# Patient Record
Sex: Male | Born: 1985 | Race: Black or African American | Hispanic: No | Marital: Married | Smoking: Never smoker
Health system: Southern US, Community
[De-identification: ages and names within clinical notes are randomized; demographics above are authoritative.]

---
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
## 2007-12-18 ENCOUNTER — Emergency Department (HOSPITAL_COMMUNITY): Admission: EM | Admit: 2007-12-18 | Discharge: 2007-12-18 | Payer: Self-pay | Admitting: Emergency Medicine

## 2007-12-20 ENCOUNTER — Emergency Department (HOSPITAL_COMMUNITY): Admission: EM | Admit: 2007-12-20 | Discharge: 2007-12-20 | Payer: Self-pay | Admitting: Emergency Medicine

## 2008-01-16 ENCOUNTER — Emergency Department (HOSPITAL_COMMUNITY): Admission: EM | Admit: 2008-01-16 | Discharge: 2008-01-16 | Payer: Self-pay | Admitting: Emergency Medicine

## 2008-01-19 ENCOUNTER — Emergency Department (HOSPITAL_COMMUNITY): Admission: EM | Admit: 2008-01-19 | Discharge: 2008-01-19 | Payer: Self-pay | Admitting: Emergency Medicine

## 2008-09-22 ENCOUNTER — Emergency Department (HOSPITAL_COMMUNITY): Admission: EM | Admit: 2008-09-22 | Discharge: 2008-09-22 | Payer: Self-pay | Admitting: Emergency Medicine

## 2008-09-26 ENCOUNTER — Emergency Department (HOSPITAL_COMMUNITY): Admission: EM | Admit: 2008-09-26 | Discharge: 2008-09-26 | Payer: Self-pay | Admitting: Emergency Medicine

## 2008-10-09 ENCOUNTER — Emergency Department (HOSPITAL_COMMUNITY): Admission: EM | Admit: 2008-10-09 | Discharge: 2008-10-09 | Payer: Self-pay | Admitting: Emergency Medicine

## 2009-04-25 ENCOUNTER — Emergency Department (HOSPITAL_COMMUNITY)
Admission: EM | Admit: 2009-04-25 | Discharge: 2009-04-25 | Payer: Self-pay | Source: Home / Self Care | Admitting: Emergency Medicine

## 2011-02-02 ENCOUNTER — Ambulatory Visit (INDEPENDENT_AMBULATORY_CARE_PROVIDER_SITE_OTHER): Payer: BC Managed Care – PPO

## 2011-02-02 DIAGNOSIS — B356 Tinea cruris: Secondary | ICD-10-CM

## 2011-07-20 ENCOUNTER — Ambulatory Visit (INDEPENDENT_AMBULATORY_CARE_PROVIDER_SITE_OTHER): Payer: BC Managed Care – PPO | Admitting: Emergency Medicine

## 2011-07-20 VITALS — BP 135/84 | HR 64 | Temp 98.3°F | Resp 16 | Ht 68.5 in | Wt 159.8 lb

## 2011-07-20 DIAGNOSIS — Z111 Encounter for screening for respiratory tuberculosis: Secondary | ICD-10-CM

## 2011-07-20 NOTE — Progress Notes (Signed)
@  UMFCLOGO@  Patient ID: John Curtis MRN: [id number], DOB: 1985/05/31, 26 y.o. Date of Encounter: 07/20/2011, 2:10 PM  Primary Physician: [name].  Chief Complaint: PPD placement  HPI: 26 y.o. year old male here for PPD. No prior positive PPD. Asymptomatic. Needs for school.    No past medical history on file.   Home Meds: Prior to Admission medications   Not on File    Allergies: No Known Allergies  History   Social History  . Marital Status: Single    Spouse Name: N/A    Number of Children: N/A  . Years of Education: N/A   Occupational History  . Not on file.   Social History Main Topics  . Smoking status: Never Smoker   . Smokeless tobacco: Not on file  . Alcohol Use: Not on file  . Drug Use: Not on file  . Sexually Active: Not on file   Other Topics Concern  . Not on file   Social History Narrative  . No narrative on file     Review of Systems: Noncontributory    ASSESSMENT AND PLAN:  26 y.o. year old male here for PPD. -PPD placed -RTC 48-72 hours for reading  Signed, Earl Lites, MD 07/20/2011 2:10 PM

## 2011-07-22 ENCOUNTER — Encounter (INDEPENDENT_AMBULATORY_CARE_PROVIDER_SITE_OTHER): Payer: BC Managed Care – PPO

## 2011-07-22 DIAGNOSIS — Z111 Encounter for screening for respiratory tuberculosis: Secondary | ICD-10-CM

## 2012-01-24 ENCOUNTER — Ambulatory Visit (INDEPENDENT_AMBULATORY_CARE_PROVIDER_SITE_OTHER): Payer: BC Managed Care – PPO | Admitting: Family Medicine

## 2012-01-24 VITALS — BP 114/73 | HR 65 | Temp 98.1°F | Resp 16 | Ht 69.0 in | Wt 156.4 lb

## 2012-01-24 DIAGNOSIS — J40 Bronchitis, not specified as acute or chronic: Secondary | ICD-10-CM

## 2012-01-24 MED ORDER — AZITHROMYCIN 250 MG PO TABS: ORAL_TABLET | ORAL | Status: DC

## 2012-01-24 MED ORDER — HYDROCODONE-HOMATROPINE 5-1.5 MG/5ML PO SYRP: 5.0000 mL | ORAL_SOLUTION | Freq: Three times a day (TID) | ORAL | Status: DC | PRN

## 2012-01-24 NOTE — Progress Notes (Signed)
@UMFCLOGO @   Patient ID: John Curtis MRN: [id number], DOB: May 21, 1985, 26 y.o. Date of Encounter: 01/24/2012, 2:49 PM  Primary Physician: [name].  Chief Complaint:  Chief Complaint  Patient presents with  . Cough    yellow mucus x 2 weeks  . Shortness of Breath    HPI: 26 y.o. year old male presents with a 14  day history of nasal congestion, post nasal drip, sore throat, and cough. Mild sinus pressure. Afebrile. No chills. Nasal congestion thick and green/yellow. Cough is productive of green/yellow sputum and not associated with time of day. Ears feel full, leading to sensation of muffled hearing. Has tried OTC cold preps without success. No GI complaints.   No sick contacts, recent antibiotics, or recent travels.   No leg trauma, sedentary periods, h/o cancer, or tobacco use.  History reviewed. No pertinent past medical history.   Home Meds: Prior to Admission medications   Medication Sig Start Date End Date Taking? Authorizing Provider  azithromycin (ZITHROMAX Z-PAK) 250 MG tablet Take as directed on pack 01/24/12   Elvina Sidle, MD  HYDROcodone-homatropine [facility]) 5-1.5 MG/5ML syrup Take 5 mLs by mouth every 8 (eight) hours as needed for cough. 01/24/12   Elvina Sidle, MD    Allergies: No Known Allergies  History   Social History  . Marital Status: Single    Spouse Name: N/A    Number of Children: N/A  . Years of Education: N/A   Occupational History  . Not on file.   Social History Main Topics  . Smoking status: Never Smoker   . Smokeless tobacco: Not on file  . Alcohol Use: Not on file  . Drug Use: Not on file  . Sexually Active: Not on file   Other Topics Concern  . Not on file   Social History Narrative  . No narrative on file     Review of Systems: Constitutional: negative for chills, fever, night sweats or weight changes Cardiovascular: negative for chest pain or palpitations Respiratory: negative for hemoptysis,  wheezing, or shortness of breath Abdominal: negative for abdominal pain, nausea, vomiting or diarrhea Dermatological: negative for rash Neurologic: negative for headache   Physical Exam Blood pressure 114/73, pulse 65, temperature 98.1 F (36.7 C), temperature source Oral, resp. rate 16, height 5\' 9"  (1.753 m), weight 156 lb 6.4 oz (70.943 kg), SpO2 99.00%., Body mass index is 23.10 kg/(m^2). General: Well developed, well nourished, in no acute distress. Head: Normocephalic, atraumatic, eyes without discharge, sclera non-icteric, nares are congested. Bilateral auditory canals clear, TM's are without perforation, pearly grey with reflective cone of light bilaterally. No sinus TTP. Oral cavity moist, dentition normal. Posterior pharynx with post nasal drip and mild erythema. No peritonsillar abscess or tonsillar exudate. Neck: Supple. No thyromegaly. Full ROM. No lymphadenopathy. Lungs: Coarse breath sounds bilaterally without wheezes, rales, or rhonchi. Breathing is unlabored.  Heart: RRR with S1 S2. No murmurs, rubs, or gallops appreciated. Msk:  Strength and tone normal for age. Extremities: No clubbing or cyanosis. No edema. Neuro: Alert and oriented X 3. Moves all extremities spontaneously. CNII-XII grossly in tact. Psych:  Responds to questions appropriately with a normal affect.      ASSESSMENT AND PLAN:  26 y.o. year old male with bronchitis. 1. Bronchitis  HYDROcodone-homatropine (HYCODAN) 5-1.5 MG/5ML syrup, azithromycin (ZITHROMAX Z-PAK) 250 MG tablet    - -Mucinex -Tylenol/Motrin prn -Rest/fluids -RTC precautions -RTC 3-5 days if no improvement  Signed, Elvina Sidle, MD 01/24/2012 2:49 PM

## 2013-06-24 ENCOUNTER — Ambulatory Visit (INDEPENDENT_AMBULATORY_CARE_PROVIDER_SITE_OTHER): Payer: BC Managed Care – PPO | Admitting: Internal Medicine

## 2013-06-24 VITALS — BP 120/72 | HR 71 | Temp 98.0°F | Resp 18 | Ht 68.5 in | Wt 165.0 lb

## 2013-06-24 DIAGNOSIS — L299 Pruritus, unspecified: Secondary | ICD-10-CM

## 2013-06-24 DIAGNOSIS — L259 Unspecified contact dermatitis, unspecified cause: Secondary | ICD-10-CM

## 2013-06-24 DIAGNOSIS — L309 Dermatitis, unspecified: Secondary | ICD-10-CM

## 2013-06-24 MED ORDER — CLOBETASOL PROPIONATE 0.05 % EX CREA: 1.0000 "application " | TOPICAL_CREAM | Freq: Two times a day (BID) | CUTANEOUS | Status: DC

## 2013-06-24 NOTE — Patient Instructions (Signed)
Eczema Eczema, also called atopic dermatitis, is a skin disorder that causes inflammation of the skin. It causes a red rash and dry, scaly skin. The skin becomes very itchy. Eczema is generally worse during the cooler winter months and often improves with the warmth of summer. Eczema usually starts showing signs in infancy. Some children outgrow eczema, but it may last through adulthood.  CAUSES  The exact cause of eczema is not known, but it appears to run in families. People with eczema often have a family history of eczema, allergies, asthma, or hay fever. Eczema is not contagious. Flare-ups of the condition may be caused by:   Contact with something you are sensitive or allergic to.   Stress. SIGNS AND SYMPTOMS  Dry, scaly skin.   Red, itchy rash.   Itchiness. This may occur before the skin rash and may be very intense.  DIAGNOSIS  The diagnosis of eczema is usually made based on symptoms and medical history. TREATMENT  Eczema cannot be cured, but symptoms usually can be controlled with treatment and other strategies. A treatment plan might include:  Controlling the itching and scratching.   Use over-the-counter antihistamines as directed for itching. This is especially useful at night when the itching tends to be worse.   Use over-the-counter steroid creams as directed for itching.   Avoid scratching. Scratching makes the rash and itching worse. It may also result in a skin infection (impetigo) due to a break in the skin caused by scratching.   Keeping the skin well moisturized with creams every day. This will seal in moisture and help prevent dryness. Lotions that contain alcohol and water should be avoided because they can dry the skin.   Limiting exposure to things that you are sensitive or allergic to (allergens).   Recognizing situations that cause stress.   Developing a plan to manage stress.  HOME CARE INSTRUCTIONS   Only take over-the-counter or  prescription medicines as directed by your health care provider.   Do not use anything on the skin without checking with your health care provider.   Keep baths or showers short (5 minutes) in warm (not hot) water. Use mild cleansers for bathing. These should be unscented. You may add nonperfumed bath oil to the bath water. It is best to avoid soap and bubble bath.   Immediately after a bath or shower, when the skin is still damp, apply a moisturizing ointment to the entire body. This ointment should be a petroleum ointment. This will seal in moisture and help prevent dryness. The thicker the ointment, the better. These should be unscented.   Keep fingernails cut short. Children with eczema may need to wear soft gloves or mittens at night after applying an ointment.   Dress in clothes made of cotton or cotton blends. Dress lightly, because heat increases itching.   A child with eczema should stay away from anyone with fever blisters or cold sores. The virus that causes fever blisters (herpes simplex) can cause a serious skin infection in children with eczema. SEEK MEDICAL CARE IF:   Your itching interferes with sleep.   Your rash gets worse or is not better within 1 week after starting treatment.   You see pus or soft yellow scabs in the rash area.   You have a fever.   You have a rash flare-up after contact with someone who has fever blisters.  Document Released: 02/01/2000 Document Revised: 11/24/2012 Document Reviewed: 09/06/2012 [employer] Patient Information 2014 [employer].  

## 2013-06-24 NOTE — Progress Notes (Signed)
   Subjective:    Patient ID: John Curtis, male    DOB: 1985-08-11, 28 y.o.   MRN: [id number]  HPI 28 year old pt here for a rash. He gets the rash when it is hot out. He has been having the rash for about three years. He mainly gets it in the crease of his elbow, neck, and on his ankles. No OTC medications used. Has not used any cortisone cream. Has had a previous rx of clobetasol and he says it worked good for him. He does not have anymore clobetasol. Pt works at [employer] [place] [place].    Review of Systems     Objective:   Physical Exam  Constitutional: He is oriented to person, place, and time. He appears well-developed and well-nourished. No distress.  HENT:  Head: Normocephalic.  Eyes: EOM are normal.  Cardiovascular: Normal rate.   Pulmonary/Chest: Effort normal.  Neurological: He is alert and oriented to person, place, and time. He exhibits normal muscle tone. Coordination normal.  Skin: Skin is intact. Rash noted. Rash is maculopapular.         Eczema    Assessment & Plan:  Clobetasol cream bid prn/cetirizine 10mg  qd

## 2013-11-18 ENCOUNTER — Ambulatory Visit (INDEPENDENT_AMBULATORY_CARE_PROVIDER_SITE_OTHER): Payer: BC Managed Care – PPO | Admitting: Family Medicine

## 2013-11-18 VITALS — BP 122/80 | HR 60 | Temp 97.8°F | Resp 16 | Ht 69.0 in | Wt 163.2 lb

## 2013-11-18 DIAGNOSIS — R1084 Generalized abdominal pain: Secondary | ICD-10-CM

## 2013-11-18 DIAGNOSIS — R195 Other fecal abnormalities: Secondary | ICD-10-CM

## 2013-11-18 DIAGNOSIS — R110 Nausea: Secondary | ICD-10-CM

## 2013-11-18 LAB — POCT CBC
GRANULOCYTE PERCENT: 76.5 % (ref 37–80)
HEMATOCRIT: 47.2 % (ref 43.5–53.7)
HEMOGLOBIN: 15.6 g/dL (ref 14.1–18.1)
Lymph, poc: 1.4 (ref 0.6–3.4)
MCH, POC: 29.4 pg (ref 27–31.2)
MCHC: 33.2 g/dL (ref 31.8–35.4)
MCV: 88.5 fL (ref 80–97)
MID (cbc): 0.2 (ref 0–0.9)
MPV: 6.5 fL (ref 0–99.8)
POC Granulocyte: 5.3 (ref 2–6.9)
POC LYMPH PERCENT: 19.9 %L (ref 10–50)
POC MID %: 3.6 %M (ref 0–12)
Platelet Count, POC: 252 10*3/uL (ref 142–424)
RBC: 5.33 M/uL (ref 4.69–6.13)
RDW, POC: 11.8 %
WBC: 6.9 10*3/uL (ref 4.6–10.2)

## 2013-11-18 NOTE — Progress Notes (Signed)
Subjective:    Patient ID: John Curtis, male    DOB: [birth year], 28 y.o.   MRN: [id number]  HPI John Curtis is a 28 y.o. male  Complains of abdominal pain - started at 11pm last night at work.  Works at [employer], loading. Mid stomach. Nausea, no vomiting. No fever. Noticed after eating 3 tangerines, but had eaten [place] food - Salsaritas and Dione Plover about 6 hours prior.   Pain feels to be tapering off since worst pain at 3am. Slight loose stool overnight, but no diarrhea. Small stool - loose this am.   Son sick recently with cough and stomach ache. No recent foreign travel.  Did eat somewhat undercooked hamburger 3 days ago.   Tx: none.   There are no active problems to display for this patient.  History reviewed. No pertinent past medical history. Past Surgical History  Procedure Laterality Date  . Hip surgery  1994   No Known Allergies Prior to Admission medications   Medication Sig Start Date End Date Taking? Authorizing Provider  clobetasol cream (TEMOVATE) 0.05 % Apply 1 application topically 2 (two) times daily. 06/24/13  Yes Jonita Albee, MD  azithromycin (ZITHROMAX Z-PAK) 250 MG tablet Take as directed on pack 01/24/12   Elvina Sidle, MD  HYDROcodone-homatropine [facility]) 5-1.5 MG/5ML syrup Take 5 mLs by mouth every 8 (eight) hours as needed for cough. 01/24/12   Elvina Sidle, MD   History   Social History  . Marital Status: Married    Spouse Name: N/A    Number of Children: N/A  . Years of Education: N/A   Occupational History  . Not on file.   Social History Main Topics  . Smoking status: Never Smoker   . Smokeless tobacco: Not on file  . Alcohol Use: Not on file  . Drug Use: Not on file  . Sexual Activity: Not on file   Other Topics Concern  . Not on file   Social History Narrative  . No narrative on file       Review of Systems     Objective:   Physical Exam  Vitals reviewed. Constitutional: He is oriented to person, place, and time.  He appears well-developed and well-nourished. No distress.  HENT:  Head: Normocephalic and atraumatic.  Mouth/Throat: Oropharynx is clear and moist.  Eyes: EOM are normal. Pupils are equal, round, and reactive to light.  Neck: No JVD present. Carotid bruit is not present.  Cardiovascular: Normal rate, regular rhythm and normal heart sounds.   No murmur heard. Pulmonary/Chest: Effort normal and breath sounds normal. He has no rales.  Abdominal: Normal appearance. Bowel sounds are increased. There is tenderness in the right upper quadrant, epigastric area and left upper quadrant. There is no rebound, no CVA tenderness, no tenderness at McBurney's point and negative Murphy's sign.  Musculoskeletal: He exhibits no edema.  Neurological: He is alert and oriented to person, place, and time.  Skin: Skin is warm and dry. He is not diaphoretic.  Psychiatric: He has a normal mood and affect.   Filed Vitals:   11/18/13 0858  BP: 122/80  Pulse: 60  Temp: 97.8 F (36.6 C)  TempSrc: Oral  Resp: 16  Height: 5\' 9"  (1.753 m)  Weight: 163 lb 3.2 oz (74.027 kg)  SpO2: 100%   . Results for orders placed in visit on 11/18/13  POCT CBC      Result Value Ref Range   WBC 6.9  4.6 - 10.2 K/uL  Lymph, poc 1.4  0.6 - 3.4   POC LYMPH PERCENT 19.9  10 - 50 %L   MID (cbc) 0.2  0 - 0.9   POC MID % 3.6  0 - 12 %M   POC Granulocyte 5.3  2 - 6.9   Granulocyte percent 76.5  37 - 80 %G   RBC 5.33  4.69 - 6.13 M/uL   Hemoglobin 15.6  14.1 - 18.1 g/dL   HCT, POC 40.947.2  81.143.5 - 53.7 %   MCV 88.5  80 - 97 fL   MCH, POC 29.4  27 - 31.2 pg   MCHC 33.2  31.8 - 35.4 g/dL   RDW, POC 91.411.8     Platelet Count, POC 252  142 - 424 K/uL   MPV 6.5  0 - 99.8 fL      Assessment & Plan:  John RoyalDarren Curtis is a 28 y.o. male Loose stools - Plan: POCT CBC  Nausea - Plan: POCT CBC  Generalized abdominal pain - Plan: POCT CBC  Viral GE vs. mild food poisoning. Symptoms slightly improved this am. Reassuring CBC.    -sx care,  bland foods, RTC precautions discussed.   No orders of the defined types were placed in this encounter.   Patient Instructions  Your blood count appears ok today. You may have "food poisoning" or a virus that appears to be improving. If any blood in stool, or any worsening over next few days - return for recheck. Ok to take Pepto and Immodium if needed. Return to the clinic or go to the nearest emergency room if any of your symptoms worsen or new symptoms occur.  Abdominal Pain Many things can cause abdominal pain. Usually, abdominal pain is not caused by a disease and will improve without treatment. It can often be observed and treated at home. Your health care provider will do a physical exam and possibly order blood tests and X-rays to help determine the seriousness of your pain. However, in many cases, more time must pass before a clear cause of the pain can be found. Before that point, your health care provider may not know if you need more testing or further treatment. HOME CARE INSTRUCTIONS  Monitor your abdominal pain for any changes. The following actions may help to alleviate any discomfort you are experiencing:  Only take over-the-counter or prescription medicines as directed by your health care provider.  Do not take laxatives unless directed to do so by your health care provider.  Try a clear liquid diet (broth, tea, or water) as directed by your health care provider. Slowly move to a bland diet as tolerated. SEEK MEDICAL CARE IF:  You have unexplained abdominal pain.  You have abdominal pain associated with nausea or diarrhea.  You have pain when you urinate or have a bowel movement.  You experience abdominal pain that wakes you in the night.  You have abdominal pain that is worsened or improved by eating food.  You have abdominal pain that is worsened with eating fatty foods.  You have a fever. SEEK IMMEDIATE MEDICAL CARE IF:   Your pain does not go away within 2  hours.  You keep throwing up (vomiting).  Your pain is felt only in portions of the abdomen, such as the right side or the left lower portion of the abdomen.  You pass bloody or black tarry stools. MAKE SURE YOU:  Understand these instructions.   Will watch your condition.   Will get help right away  if you are not doing well or get worse.  Document Released: 11/13/2004 Document Revised: 02/08/2013 Document Reviewed: 10/13/2012 [facility] Patient Information 2015 [place], [place]. This information is not intended to replace advice given to you by your health care provider. Make sure you discuss any questions you have with your health care provider.

## 2013-11-18 NOTE — Patient Instructions (Signed)
Your blood count appears ok today. You may have "food poisoning" or a virus that appears to be improving. If any blood in stool, or any worsening over next few days - return for recheck. Ok to take Pepto and Immodium if needed. Return to the clinic or go to the nearest emergency room if any of your symptoms worsen or new symptoms occur.  Abdominal Pain Many things can cause abdominal pain. Usually, abdominal pain is not caused by a disease and will improve without treatment. It can often be observed and treated at home. Your health care provider will do a physical exam and possibly order blood tests and X-rays to help determine the seriousness of your pain. However, in many cases, more time must pass before a clear cause of the pain can be found. Before that point, your health care provider may not know if you need more testing or further treatment. HOME CARE INSTRUCTIONS  Monitor your abdominal pain for any changes. The following actions may help to alleviate any discomfort you are experiencing:  Only take over-the-counter or prescription medicines as directed by your health care provider.  Do not take laxatives unless directed to do so by your health care provider.  Try a clear liquid diet (broth, tea, or water) as directed by your health care provider. Slowly move to a bland diet as tolerated. SEEK MEDICAL CARE IF:  You have unexplained abdominal pain.  You have abdominal pain associated with nausea or diarrhea.  You have pain when you urinate or have a bowel movement.  You experience abdominal pain that wakes you in the night.  You have abdominal pain that is worsened or improved by eating food.  You have abdominal pain that is worsened with eating fatty foods.  You have a fever. SEEK IMMEDIATE MEDICAL CARE IF:   Your pain does not go away within 2 hours.  You keep throwing up (vomiting).  Your pain is felt only in portions of the abdomen, such as the right side or the left lower  portion of the abdomen.  You pass bloody or black tarry stools. MAKE SURE YOU:  Understand these instructions.   Will watch your condition.   Will get help right away if you are not doing well or get worse.  Document Released: 11/13/2004 Document Revised: 02/08/2013 Document Reviewed: 10/13/2012 [facility] Patient Information 2015 [place]. This information is not intended to replace advice given to you by your health care provider. Make sure you discuss any questions you have with your health care provider.

## 2014-01-19 ENCOUNTER — Ambulatory Visit (INDEPENDENT_AMBULATORY_CARE_PROVIDER_SITE_OTHER): Payer: BC Managed Care – PPO | Admitting: Sports Medicine

## 2014-01-19 VITALS — BP 120/70 | HR 69 | Temp 98.4°F | Resp 12 | Ht 68.75 in | Wt 165.2 lb

## 2014-01-19 DIAGNOSIS — J069 Acute upper respiratory infection, unspecified: Secondary | ICD-10-CM

## 2014-01-19 MED ORDER — PSEUDOEPHEDRINE HCL 60 MG PO TABS: 60.0000 mg | ORAL_TABLET | Freq: Three times a day (TID) | ORAL | Status: DC | PRN

## 2014-01-19 MED ORDER — OXYMETAZOLINE HCL 0.05 % NA SOLN: 1.0000 | Freq: Two times a day (BID) | NASAL | Status: DC

## 2014-01-19 NOTE — Progress Notes (Signed)
   Subjective:    Patient ID: John Curtis, male    DOB: 03/18/1985, 28 y.o.   MRN: [id number]  HPI John Curtis is a 28yo male who presents with cough and nasal congestion for the past week. He noted body aches initially. He noted a mild generalized headache. He then developed congestion and sore throat with a non-productive cough. He thinks he noticed a subjective fever and chill initially, but this since resolved. He is not currently taking any medications. He has tried resting and drinking fluids.  Sick contacts: His parents, who he lives with similar symptoms. No recent travel.  Wife is pregnant at home. He did not receive a flu vaccine.  Past medical history, social history, medications, and allergies were reviewed and are up to date in the chart.  Review of Systems 7 point review of systems was performed and was otherwise negative unless noted in the history of present illness.     Objective:   Physical Exam BP 120/70 mmHg  Pulse 69  Temp(Src) 98.4 F (36.9 C) (Oral)  Resp 12  Ht 5' 8.75" (1.746 m)  Wt 165 lb 4 oz (74.957 kg)  BMI 24.59 kg/m2  SpO2 98% General appearance: alert, cooperative and appears stated age Head: Normocephalic, without obvious abnormality, atraumatic Eyes: conjunctivae/corneas clear. PERRL, EOM's intact. Fundi benign. Ears: normal TM's and external ear canals both ears Nose: erythema, clear nasal discharge. Throat: post nasal drip. uvula midline. no peritonsillar abscess Neck: no adenopathy, no carotid bruit, no JVD, supple, symmetrical, trachea midline and thyroid not enlarged, symmetric, no tenderness/mass/nodules Lungs: clear to auscultation bilaterally Heart: regular rate and rhythm, S1, S2 normal, no murmur, click, rub or gallop Skin: Skin color, texture, turgor normal. No rashes or lesions Lymph nodes: Cervical, supraclavicular, and axillary nodes normal.    Assessment & Plan:  1. Upper respiratory infection -Rx Afrin nasal spray and Sudafed  with precautions -Discussed supportive cares -If noticing increasing cough, fever >102, shortness of breath, wheezing, lethargy, or for any other concerns, then return to the clinic or go to the emergency department. Patient verbalized understanding and agreement. -Recommended flu vaccine when he is feeling better, as well as make sure his pregnant wife is vaccinated.  Dr. Joellyn HaffPick-Jacobs, DO Sports Medicine Fellow

## 2014-01-19 NOTE — Patient Instructions (Signed)
Upper Respiratory Infection, Adult An upper respiratory infection (URI) is also sometimes known as the common cold. The upper respiratory tract includes the nose, sinuses, throat, trachea, and bronchi. Bronchi are the airways leading to the lungs. Most people improve within 1 week, but symptoms can last up to 2 weeks. A residual cough may last even longer.  CAUSES Many different viruses can infect the tissues lining the upper respiratory tract. The tissues become irritated and inflamed and often become very moist. Mucus production is also common. A cold is contagious. You can easily spread the virus to others by oral contact. This includes kissing, sharing a glass, coughing, or sneezing. Touching your mouth or nose and then touching a surface, which is then touched by another person, can also spread the virus. SYMPTOMS  Symptoms typically develop 1 to 3 days after you come in contact with a cold virus. Symptoms vary from person to person. They may include:  Runny nose.  Sneezing.  Nasal congestion.  Sinus irritation.  Sore throat.  Loss of voice (laryngitis).  Cough.  Fatigue.  Muscle aches.  Loss of appetite.  Headache.  Low-grade fever. DIAGNOSIS  You might diagnose your own cold based on familiar symptoms, since most people get a cold 2 to 3 times a year. Your caregiver can confirm this based on your exam. Most importantly, your caregiver can check that your symptoms are not due to another disease such as strep throat, sinusitis, pneumonia, asthma, or epiglottitis. Blood tests, throat tests, and X-rays are not necessary to diagnose a common cold, but they may sometimes be helpful in excluding other more serious diseases. Your caregiver will decide if any further tests are required. RISKS AND COMPLICATIONS  You may be at risk for a more severe case of the common cold if you smoke cigarettes, have chronic heart disease (such as heart failure) or lung disease (such as asthma), or if  you have a weakened immune system. The very young and very old are also at risk for more serious infections. Bacterial sinusitis, middle ear infections, and bacterial pneumonia can complicate the common cold. The common cold can worsen asthma and chronic obstructive pulmonary disease (COPD). Sometimes, these complications can require emergency medical care and may be life-threatening. PREVENTION  The best way to protect against getting a cold is to practice good hygiene. Avoid oral or hand contact with people with cold symptoms. Wash your hands often if contact occurs. There is no clear evidence that vitamin C, vitamin E, echinacea, or exercise reduces the chance of developing a cold. However, it is always recommended to get plenty of rest and practice good nutrition. TREATMENT  Treatment is directed at relieving symptoms. There is no cure. Antibiotics are not effective, because the infection is caused by a virus, not by bacteria. Treatment may include:  Increased fluid intake. Sports drinks offer valuable electrolytes, sugars, and fluids.  Breathing heated mist or steam (vaporizer or shower).  Eating chicken soup or other clear broths, and maintaining good nutrition.  Getting plenty of rest.  Using gargles or lozenges for comfort.  Controlling fevers with ibuprofen or acetaminophen as directed by your caregiver.  Increasing usage of your inhaler if you have asthma. Zinc gel and zinc lozenges, taken in the first 24 hours of the common cold, can shorten the duration and lessen the severity of symptoms. Pain medicines may help with fever, muscle aches, and throat pain. A variety of non-prescription medicines are available to treat congestion and runny nose. Your caregiver   can make recommendations and may suggest nasal or lung inhalers for other symptoms.  HOME CARE INSTRUCTIONS   Only take over-the-counter or prescription medicines for pain, discomfort, or fever as directed by your  caregiver.  Use a warm mist humidifier or inhale steam from a shower to increase air moisture. This may keep secretions moist and make it easier to breathe.  Drink enough water and fluids to keep your urine clear or pale yellow.  Rest as needed.  Return to work when your temperature has returned to normal or as your caregiver advises. You may need to stay home longer to avoid infecting others. You can also use a face mask and careful hand washing to prevent spread of the virus. SEEK MEDICAL CARE IF:   After the first few days, you feel you are getting worse rather than better.  You need your caregiver's advice about medicines to control symptoms.  You develop chills, worsening shortness of breath, or brown or red sputum. These may be signs of pneumonia.  You develop yellow or brown nasal discharge or pain in the face, especially when you bend forward. These may be signs of sinusitis.  You develop a fever, swollen neck glands, pain with swallowing, or white areas in the back of your throat. These may be signs of strep throat. SEEK IMMEDIATE MEDICAL CARE IF:   You have a fever.  You develop severe or persistent headache, ear pain, sinus pain, or chest pain.  You develop wheezing, a prolonged cough, cough up blood, or have a change in your usual mucus (if you have chronic lung disease).  You develop sore muscles or a stiff neck. Document Released: 07/30/2000 Document Revised: 04/28/2011 Document Reviewed: 05/11/2013 Exit[employer] Patient Information 2015 [employer]. This information is not intended to replace advice given to you by your health care provider. Make sure you discuss any questions you have with your health care provider.  

## 2015-04-12 ENCOUNTER — Ambulatory Visit (INDEPENDENT_AMBULATORY_CARE_PROVIDER_SITE_OTHER): Payer: BLUE CROSS/BLUE SHIELD | Admitting: Family Medicine

## 2015-04-12 VITALS — BP 124/82 | HR 87 | Temp 98.2°F | Resp 16 | Ht 70.0 in | Wt 171.0 lb

## 2015-04-12 DIAGNOSIS — Z13220 Encounter for screening for lipoid disorders: Secondary | ICD-10-CM | POA: Diagnosis not present

## 2015-04-12 DIAGNOSIS — Z130 Encounter for screening for diseases of the blood and blood-forming organs and certain disorders involving the immune mechanism: Secondary | ICD-10-CM

## 2015-04-12 DIAGNOSIS — Z1329 Encounter for screening for other suspected endocrine disorder: Secondary | ICD-10-CM | POA: Diagnosis not present

## 2015-04-12 DIAGNOSIS — Q383 Other congenital malformations of tongue: Secondary | ICD-10-CM

## 2015-04-12 DIAGNOSIS — Z0000 Encounter for general adult medical examination without abnormal findings: Secondary | ICD-10-CM | POA: Diagnosis not present

## 2015-04-12 LAB — CBC
HCT: 45.3 % (ref 39.0–52.0)
Hemoglobin: 15.1 g/dL (ref 13.0–17.0)
MCH: 28.6 pg (ref 26.0–34.0)
MCHC: 33.3 g/dL (ref 30.0–36.0)
MCV: 85.8 fL (ref 78.0–100.0)
MPV: 8.9 fL (ref 8.6–12.4)
Platelets: 271 10*3/uL (ref 150–400)
RBC: 5.28 MIL/uL (ref 4.22–5.81)
RDW: 13 % (ref 11.5–15.5)
WBC: 7.2 10*3/uL (ref 4.0–10.5)

## 2015-04-12 LAB — TSH: TSH: 0.63 mIU/L (ref 0.40–4.50)

## 2015-04-12 NOTE — Progress Notes (Signed)
Chief Complaint:  Chief Complaint  Patient presents with  . Mass    on tongue, today  . Annual Exam    HPI: John Curtis is a 30 y.o. male who reports to [facility] today complaining of  1. Blood blister on the right side of the tongue , he doe snot remember biting it. He has never had this before, he noticed it yesterday. He has had no fevers or chills. He has no weight loss.  2. He wants an annual  No hx of STD, he has 3 children, he has had a vesctomy. He has no  No prior STD, No prior hx of MI .  Last annual was no prior annual HAs not eaten anything today, No depression.  Exercising, last dental exam was  2016. He does not chew tobacco or smoke.  LAst eye exam was in Septe mebr 2016  History reviewed. No pertinent past medical history. Past Surgical History  Procedure Laterality Date  . Hip surgery  1994   Social History   Social History  . Marital Status: Married    Spouse Name: N/A  . Number of Children: N/A  . Years of Education: N/A   Social History Main Topics  . Smoking status: Never Smoker   . Smokeless tobacco: Never Used  . Alcohol Use: No  . Drug Use: No  . Sexual Activity: Not Asked   Other Topics Concern  . None   Social History Narrative   History reviewed. No pertinent family history. No Known Allergies Prior to Admission medications   Medication Sig Start Date End Date Taking? Authorizing Provider  oxymetazoline (AFRIN NASAL SPRAY) 0.05 % nasal spray Place 1 spray into both nostrils 2 (two) times daily. Patient not taking: Reported on 04/12/2015 01/19/14   John C Pick-Jacobs, DO  pseudoephedrine (SUDAFED) 60 MG tablet Take 1 tablet (60 mg total) by mouth every 8 (eight) hours as needed for congestion. Patient not taking: Reported on 04/12/2015 01/19/14   John C Pick-Jacobs, DO     ROS: The patient denies fevers, chills, night sweats, unintentional weight loss, chest pain, palpitations, wheezing, dyspnea on exertion, nausea, vomiting, abdominal  pain, dysuria, hematuria, melena, numbness, weakness, or tingling.   All other systems have been reviewed and were otherwise negative with the exception of those mentioned in the HPI and as above.    PHYSICAL EXAM: Filed Vitals:   04/12/15 1525  BP: 124/82  Pulse: 87  Temp: 98.2 F (36.8 C)  Resp: 16   Body mass index is 24.54 kg/(m^2).   General: Alert, no acute distress HEENT:  Normocephalic, atraumatic, oropharynx patent. EOMI, PERRLA + right tongue bloodblister  Cardiovascular:  Regular rate and rhythm, no rubs murmurs or gallops.  No Carotid bruits, radial pulse intact. No pedal edema.  Respiratory: Clear to auscultation bilaterally.  No wheezes, rales, or rhonchi.  No cyanosis, no use of accessory musculature Abdominal: No organomegaly, abdomen is soft and non-tender, positive bowel sounds. No masses. Skin: No rashes. Neurologic: Facial musculature symmetric. Psychiatric: Patient acts appropriately throughout our interaction. Lymphatic: No cervical or submandibular lymphadenopathy Musculoskeletal: Gait intact. No edema, tenderness Normal testicualr exam, neg inguinal hernia, circumcised male    LABS: Results for orders placed or performed in visit on 11/18/13  POCT CBC  Result Value Ref Range   WBC 6.9 4.6 - 10.2 K/uL   Lymph, poc 1.4 0.6 - 3.4   POC LYMPH PERCENT 19.9 10 - 50 %L   MID (cbc)  0.2 0 - 0.9   POC MID % 3.6 0 - 12 %M   POC Granulocyte 5.3 2 - 6.9   Granulocyte percent 76.5 37 - 80 %G   RBC 5.33 4.69 - 6.13 M/uL   Hemoglobin 15.6 14.1 - 18.1 g/dL   HCT, POC 16.1 09.6 - 53.7 %   MCV 88.5 80 - 97 fL   MCH, POC 29.4 27 - 31.2 pg   MCHC 33.2 31.8 - 35.4 g/dL   RDW, POC 04.5 %   Platelet Count, POC 252 142 - 424 K/uL   MPV 6.5 0 - 99.8 fL     EKG/XRAY:   Primary read interpreted by Dr. Conley Rolls at [facility].   ASSESSMENT/PLAN: Encounter Diagnoses  Name Primary?  . Annual physical exam Yes  . Tongue anomaly   . Screening for deficiency anemia   .  Screening for hyperlipidemia   . Screening for thyroid disorder    Annual labs He will consider TDap  Next visit He popped the blood blister on his tongue, which I think he got from biting it. It looks benign, I have advised him to monitor it.  Fu prn   Gross sideeffects, risk and benefits, and alternatives of medications d/w patient. Patient is aware that all medications have potential sideeffects and we are unable to predict every sideeffect or drug-drug interaction that may occur.  Bev Drennen DO  04/12/2015 4:51 PM

## 2015-04-13 LAB — COMPLETE METABOLIC PANEL WITHOUT GFR
AST: 24 U/L (ref 10–40)
Albumin: 4.6 g/dL (ref 3.6–5.1)
Alkaline Phosphatase: 55 U/L (ref 40–115)
Calcium: 9.7 mg/dL (ref 8.6–10.3)
Chloride: 100 mmol/L (ref 98–110)
GFR, Est Non African American: >89 mL/min (ref >=60)
Potassium: 4.6 mmol/L (ref 3.5–5.3)

## 2015-04-13 LAB — LIPID PANEL
Cholesterol: 173 mg/dL (ref 125–200)
HDL: 92 mg/dL (ref >=40)
LDL Cholesterol: 71 mg/dL (ref <130)
Total CHOL/HDL Ratio: 1.9 ratio (ref <=5.0)
Triglycerides: 48 mg/dL (ref <150)
VLDL: 10 mg/dL (ref <30)

## 2015-04-13 LAB — COMPLETE METABOLIC PANEL WITH GFR
ALT: 16 U/L (ref 9–46)
BUN: 15 mg/dL (ref 7–25)
CO2: 26 mmol/L (ref 20–31)
Creat: 1.03 mg/dL (ref 0.60–1.35)
GFR, Est African American: >89 mL/min (ref >=60)
Glucose, Bld: 81 mg/dL (ref 65–99)
Sodium: 137 mmol/L (ref 135–146)
Total Bilirubin: 1.5 mg/dL — ABNORMAL HIGH (ref 0.2–1.2)
Total Protein: 7.7 g/dL (ref 6.1–8.1)

## 2015-12-06 ENCOUNTER — Ambulatory Visit (INDEPENDENT_AMBULATORY_CARE_PROVIDER_SITE_OTHER): Payer: BLUE CROSS/BLUE SHIELD

## 2015-12-06 ENCOUNTER — Ambulatory Visit (INDEPENDENT_AMBULATORY_CARE_PROVIDER_SITE_OTHER): Payer: BLUE CROSS/BLUE SHIELD | Admitting: Physician Assistant

## 2015-12-06 VITALS — BP 124/80 | HR 74 | Temp 98.3°F | Resp 18 | Ht 70.0 in | Wt 173.0 lb

## 2015-12-06 DIAGNOSIS — R071 Chest pain on breathing: Secondary | ICD-10-CM | POA: Diagnosis not present

## 2015-12-06 DIAGNOSIS — R0789 Other chest pain: Secondary | ICD-10-CM

## 2015-12-06 MED ORDER — NAPROXEN 500 MG PO TABS: 500.0000 mg | ORAL_TABLET | Freq: Two times a day (BID) | ORAL | 0 refills | Status: AC

## 2015-12-06 NOTE — Patient Instructions (Addendum)
Take naproxen twice a day for the next week.  If no improvement in one week, follow up.   Costochondritis Costochondritis is a condition in which the tissue (cartilage) that connects your ribs with your breastbone (sternum) becomes irritated. It causes pain in the chest and rib area. It usually goes away on its own over time. HOME CARE  Avoid activities that wear you out.  Do not strain your ribs. Avoid activities that use your:  Chest.  Belly.  Side muscles.  Put ice on the area for the first 2 days after the pain starts.  Put ice in a plastic bag.  Place a towel between your skin and the bag.  Leave the ice on for 20 minutes, 2-3 times a day.  Only take medicine as told by your doctor. GET HELP IF:  You have redness or puffiness (swelling) in the rib area.  Your pain does not go away with rest or medicine. GET HELP RIGHT AWAY IF:   Your pain gets worse.  You are very uncomfortable.  You have trouble breathing.  You cough up blood.  You start sweating or throwing up (vomiting).  You have a fever or lasting symptoms for more than 2-3 days.  You have a fever and your symptoms suddenly get worse. MAKE SURE YOU:   Understand these instructions.  Will watch your condition.  Will get help right away if you are not doing well or get worse.   This information is not intended to replace advice given to you by your health care provider. Make sure you discuss any questions you have with your health care provider.   Document Released: 07/23/2007 Document Revised: 10/06/2012 Document Reviewed: 09/07/2012 Elsevier Interactive Patient Education 2016 [employer]     IF you received an x-ray today, you will receive an invoice from [facility]. Please contact [facility] at [phone] with questions or concerns regarding your invoice.   IF you received labwork today, you will receive an invoice from [facility]. Please  contact [facility] at [phone] with questions or concerns regarding your invoice.   Our billing staff will not be able to assist you with questions regarding bills from these companies.  You will be contacted with the lab results as soon as they are available. The fastest way to get your results is to activate your My Chart account. Instructions are located on the last page of this paperwork. If you have not heard from us regarding the results in 2 weeks, please contact this office.

## 2015-12-06 NOTE — Progress Notes (Signed)
John Curtis Haupert  MRN: [id number] DOB: 12-02-1985  Subjective:  John Curtis is a 30 y.o. male seen in office today for a chief complaint of sternum pain x 1 day.   Pt was working out this past weekend really hard doing lots of dips and did fine afterwards. However he went to work at [employer] yesterday and had to move heavy boxes/furniture and then later that night noticed his sternum was painful when he did certain movements. Denies radiating symptoms, hearing a popping sensation, decreased ROM, exertional chest pain, radiating symptoms, and numbness.   He has not tried anything for relief.   Review of Systems  Respiratory: Negative for cough, shortness of breath and wheezing.   Cardiovascular: Negative for palpitations.  Musculoskeletal: Negative for back pain, gait problem and neck pain.  Neurological: Negative for weakness and numbness.   There are no active problems to display for this patient.   No current outpatient prescriptions on file prior to visit.   No current facility-administered medications on file prior to visit.     No Known Allergies  Social History   Social History  . Marital status: Married    Spouse name: N/A  . Number of children: N/A  . Years of education: N/A   Occupational History  . Not on file.   Social History Main Topics  . Smoking status: Never Smoker  . Smokeless tobacco: Never Used  . Alcohol use No  . Drug use: No  . Sexual activity: Not on file   Other Topics Concern  . Not on file   Social History Narrative  . No narrative on file     Objective:  BP 124/80 (BP Location: Right Arm, Patient Position: Sitting, Cuff Size: Small)   Pulse 74   Temp 98.3 F (36.8 C) (Oral)   Resp 18   Ht 5\' 10"  (1.778 m)   Wt 173 lb (78.5 kg)   SpO2 100%   BMI 24.82 kg/m   Physical Exam  Constitutional: He is oriented to person, place, and time and well-developed, well-nourished, and in no distress.  HENT:  Head: Normocephalic and atraumatic.    Eyes: Conjunctivae are normal.  Neck: Normal range of motion. No spinous process tenderness and no muscular tenderness present. Normal range of motion present.  Pulmonary/Chest: Effort normal and breath sounds normal. He exhibits tenderness.    Musculoskeletal:       Right shoulder: Normal.       Left shoulder: Normal.       Cervical back: Normal.       Thoracic back: Normal.  Neurological: He is alert and oriented to person, place, and time. Gait normal.  Skin: Skin is warm and dry.  Psychiatric: Affect normal.  Vitals reviewed.  Dg Sternum  Result Date: 12/06/2015 CLINICAL DATA:  Sternal pain after working out. EXAM: STERNUM - 2+ VIEW COMPARISON:  None. FINDINGS: There is no evidence of fracture or other focal bone lesions. IMPRESSION: Negative. Electronically Signed   By: Marlan Palauharles  Clark M.D.   On: 12/06/2015 18:47    Assessment and Plan :   1. Sternum pain - DG Sternum  2. Costochondral pain -Likely costochondritis -Recommend rest, NSAIDs, and ice to affected area - naproxen (NAPROSYN) 500 MG tablet; Take 1 tablet (500 mg total) by mouth 2 (two) times daily with a meal.  Dispense: 30 tablet; Refill: 0 -Return to clinic in 7 days if no improvement or sooner if symptoms worsen  Benjiman CoreBrittany Colbie Danner PA-C  [facility] 12/06/2015 6:17 PM

## 2020-01-09 ENCOUNTER — Emergency Department (HOSPITAL_COMMUNITY): Payer: BC Managed Care – PPO

## 2020-01-09 ENCOUNTER — Emergency Department (HOSPITAL_COMMUNITY)
Admission: EM | Admit: 2020-01-09 | Discharge: 2020-01-10 | Disposition: A | Discharge status: Home / Self Care | Payer: BC Managed Care – PPO | Attending: Emergency Medicine | Admitting: Emergency Medicine

## 2020-01-09 ENCOUNTER — Other Ambulatory Visit: Payer: Self-pay

## 2020-01-09 ENCOUNTER — Encounter (HOSPITAL_COMMUNITY): Payer: Self-pay

## 2020-01-09 DIAGNOSIS — Z20822 Contact with and (suspected) exposure to covid-19: Secondary | ICD-10-CM | POA: Diagnosis not present

## 2020-01-09 DIAGNOSIS — R072 Precordial pain: Secondary | ICD-10-CM

## 2020-01-09 DIAGNOSIS — R519 Headache, unspecified: Secondary | ICD-10-CM | POA: Diagnosis not present

## 2020-01-09 DIAGNOSIS — R079 Chest pain, unspecified: Secondary | ICD-10-CM | POA: Diagnosis present

## 2020-01-09 DIAGNOSIS — I10 Essential (primary) hypertension: Secondary | ICD-10-CM | POA: Insufficient documentation

## 2020-01-09 DIAGNOSIS — R000 Tachycardia, unspecified: Secondary | ICD-10-CM | POA: Insufficient documentation

## 2020-01-09 LAB — CBC WITH DIFFERENTIAL/PLATELET
Abs Immature Granulocytes: 0.02 10*3/uL (ref 0.00–0.07)
Basophils Absolute: 0 10*3/uL (ref 0.0–0.1)
Basophils Relative: 0 %
Eosinophils Absolute: 0.1 10*3/uL (ref 0.0–0.5)
Eosinophils Relative: 1 %
HCT: 46.1 % (ref 39.0–52.0)
Hemoglobin: 14.6 g/dL (ref 13.0–17.0)
Immature Granulocytes: 0 %
Lymphocytes Relative: 28 %
Lymphs Abs: 2.2 10*3/uL (ref 0.7–4.0)
MCH: 28.2 pg (ref 26.0–34.0)
MCHC: 31.7 g/dL (ref 30.0–36.0)
MCV: 89 fL (ref 80.0–100.0)
Monocytes Absolute: 0.4 10*3/uL (ref 0.1–1.0)
Monocytes Relative: 6 %
Neutro Abs: 5.1 10*3/uL (ref 1.7–7.7)
Neutrophils Relative %: 65 %
Platelets: 282 10*3/uL (ref 150–400)
RBC: 5.18 MIL/uL (ref 4.22–5.81)
RDW: 11.6 % (ref 11.5–15.5)
WBC: 7.8 10*3/uL (ref 4.0–10.5)
nRBC: 0 % (ref 0.0–0.2)

## 2020-01-09 LAB — COMPREHENSIVE METABOLIC PANEL
ALT: 19 U/L (ref 0–44)
AST: 26 U/L (ref 15–41)
Albumin: 4 g/dL (ref 3.5–5.0)
Alkaline Phosphatase: 48 U/L (ref 38–126)
Anion gap: 10 (ref 5–15)
BUN: 13 mg/dL (ref 6–20)
CO2: 25 mmol/L (ref 22–32)
Calcium: 9.1 mg/dL (ref 8.9–10.3)
Chloride: 99 mmol/L (ref 98–111)
Creatinine, Ser: 1.33 mg/dL — ABNORMAL HIGH (ref 0.61–1.24)
GFR, Estimated: >60 mL/min (ref >60)
Glucose, Bld: 186 mg/dL — ABNORMAL HIGH (ref 70–99)
Potassium: 3.7 mmol/L (ref 3.5–5.1)
Sodium: 134 mmol/L — ABNORMAL LOW (ref 135–145)
Total Bilirubin: 1.2 mg/dL (ref 0.3–1.2)
Total Protein: 7 g/dL (ref 6.5–8.1)

## 2020-01-09 LAB — D-DIMER, QUANTITATIVE (NOT AT ARMC): D-Dimer, Quant: 0.62 ug/mL-FEU — ABNORMAL HIGH (ref 0.00–0.50)

## 2020-01-09 LAB — LIPASE, BLOOD: Lipase: 24 U/L (ref 11–51)

## 2020-01-09 LAB — TROPONIN I (HIGH SENSITIVITY)
Troponin I (High Sensitivity): 6 ng/L (ref <18)
Troponin I (High Sensitivity): 12 ng/L (ref <18)

## 2020-01-09 MED ORDER — SODIUM CHLORIDE 0.9 % IV BOLUS
1000.0000 mL | Freq: Once | INTRAVENOUS | Status: AC
  Administered 2020-01-09: 1000 mL via INTRAVENOUS

## 2020-01-09 MED ORDER — METOCLOPRAMIDE HCL 5 MG/ML IJ SOLN
10.0000 mg | Freq: Once | INTRAMUSCULAR | Status: AC
  Administered 2020-01-09: 10 mg via INTRAVENOUS
  Filled 2020-01-09: qty 2

## 2020-01-09 MED ORDER — IOHEXOL 350 MG/ML SOLN
75.0000 mL | Freq: Once | INTRAVENOUS | Status: AC | PRN
  Administered 2020-01-09: 75 mL via INTRAVENOUS

## 2020-01-09 NOTE — ED Triage Notes (Signed)
Pt BIB [facility] from Home withy several complaints.   Pt was working lifting and moving boxes when pt had sudden onset of L. Sided CP and Head Pressure.   Upon EMS arrival pt was hypertensive at 230 systolic and was given 2 NTG and 324 ASA. BP dropped to 140 systolic.   Other VSS with EMS but pt tachycardic at 110s.   A&Ox4. GCS 15.

## 2020-01-09 NOTE — ED Notes (Signed)
Pt ambulated well to and from the BR with no signs of mobility issues, SOB, CP, or any other complaints. Pt resting comfortably in the stretcher at this time.

## 2020-01-09 NOTE — ED Notes (Signed)
Pt returned from CT °

## 2020-01-09 NOTE — ED Provider Notes (Signed)
[facility] EMERGENCY DEPARTMENT Provider Note   CSN: [id number] Arrival date & time: 01/09/20  1930     History Chief Complaint  Patient presents with  . Headache  . Hypertension  . Chest Pain  . Tachycardia    John Curtis is a 34 y.o. male.  The history is provided by the patient, medical records and the EMS personnel.  Headache Hypertension Associated symptoms include chest pain and headaches.  Chest Pain Associated symptoms: headache    John Curtis is a 34 y.o. male who presents to the Emergency Department complaining of chest pain and headache. He presents the emergency department by EMS complaining of left sided chest pain that started while working for [employer]. Pain is described as a pressure sensation. He then later developed a throbbing and shooting pain across his head. Chest pain began around 6 PM and the headache started a little bit later. On EMS arrival he was found to be hypertensive with pressure 230 systolic. He was treated with two nitroglycerin and 324 aspirin prior to ED arrival and his blood pressure significantly improved. He does have a remote history of hypertension and was previously on medications but with diet adjustments and exercise he was able to get off medications and his blood pressure is normally around 130 systolic. He denies any recent illnesses. Overall his symptoms are nearly resolved. He does have some mild persistent left-sided chest discomfort. He denies any vision changes, numbness, weakness, difficulty breathing, diaphoresis, nausea, vomiting, abdominal pain. No prior similar symptoms. No significant past cardiac history in the family. He does have an aunt with a history of stroke. He does not smoke, use alcohol, use drugs.    History reviewed. No pertinent past medical history.  There are no problems to display for this patient.   Past Surgical History:  Procedure Laterality Date  . HIP SURGERY  1994       No  family history on file.  Social History   Tobacco Use  . Smoking status: Never Smoker  . Smokeless tobacco: Never Used  Substance Use Topics  . Alcohol use: No    Alcohol/week: 0.0 standard drinks  . Drug use: No    Home Medications Prior to Admission medications   Medication Sig Start Date End Date Taking? Authorizing Provider  naproxen (NAPROSYN) 500 MG tablet Take 1 tablet (500 mg total) by mouth 2 (two) times daily with a meal. 12/06/15   Benjiman Core D, PA-C    Allergies    Patient has no known allergies.  Review of Systems   Review of Systems  Cardiovascular: Positive for chest pain.  Neurological: Positive for headaches.  All other systems reviewed and are negative.   Physical Exam Updated Vital Signs BP 123/75 (BP Location: Right Arm)   Pulse 94   Temp 97.9 F (36.6 C) (Oral)   Resp 15   Ht 5\' 9"  (1.753 m)   Wt 79.8 kg   SpO2 96%   BMI 25.99 kg/m   Physical Exam Vitals and nursing note reviewed.  Constitutional:      Appearance: He is well-developed.  HENT:     Head: Normocephalic and atraumatic.  Cardiovascular:     Rate and Rhythm: Regular rhythm. Tachycardia present.     Heart sounds: No murmur heard.   Pulmonary:     Effort: Pulmonary effort is normal. No respiratory distress.     Breath sounds: Normal breath sounds.  Abdominal:     Palpations: Abdomen is soft.  Tenderness: There is no abdominal tenderness. There is no guarding or rebound.  Musculoskeletal:        General: No swelling or tenderness.  Skin:    General: Skin is warm and dry.  Neurological:     Mental Status: He is alert and oriented to person, place, and time.     Comments: 5/5 strength in all four extremities.    Psychiatric:        Behavior: Behavior normal.     ED Results / Procedures / Treatments   Labs (all labs ordered are listed, but only abnormal results are displayed) Labs Reviewed  COMPREHENSIVE METABOLIC PANEL - Abnormal; Notable for the following  components:      Result Value   Sodium 134 (*)    Glucose, Bld 186 (*)    Creatinine, Ser 1.33 (*)    All other components within normal limits  D-DIMER, QUANTITATIVE (NOT AT [facility]) - Abnormal; Notable for the following components:   D-Dimer, Quant 0.62 (*)    All other components within normal limits  RESPIRATORY PANEL BY RT PCR (FLU A&B, COVID)  LIPASE, BLOOD  CBC WITH DIFFERENTIAL/PLATELET  TROPONIN I (HIGH SENSITIVITY)  TROPONIN I (HIGH SENSITIVITY)    EKG EKG Interpretation  Date/Time:  Monday January 09 2020 19:41:59 EST Ventricular Rate:  127 PR Interval:    QRS Duration: 92 QT Interval:  308 QTC Calculation: 448 R Axis:   111 Text Interpretation: Right and left arm electrode reversal, interpretation assumes no reversal Sinus tachycardia Right axis deviation RSR' in V1 or V2, probably normal variant Abnormal T, consider ischemia, lateral leads no prior available for ocmparison Confirmed by Tilden Fossa [phone]) on 01/09/2020 8:30:54 PM   Radiology CT Head Wo Contrast  Result Date: 01/09/2020 CLINICAL DATA:  34 year old male with headache. EXAM: CT HEAD WITHOUT CONTRAST TECHNIQUE: Contiguous axial images were obtained from the base of the skull through the vertex without intravenous contrast. COMPARISON:  None. FINDINGS: Brain: The ventricles and sulci appropriate size for patient's age. The gray-white matter discrimination is preserved. There is no acute intracranial hemorrhage. No mass effect midline shift no extra-axial fluid collection. Vascular: No hyperdense vessel or unexpected calcification. Skull: Normal. Negative for fracture or focal lesion. Sinuses/Orbits: No acute finding. Other: None IMPRESSION: Unremarkable noncontrast CT of the brain. Electronically Signed   By: Elgie Collard M.D.   On: 01/09/2020 20:45   CT Angio Chest PE W/Cm &/Or Wo Cm  Result Date: 01/09/2020 CLINICAL DATA:  Left-sided chest pain. EXAM: CT ANGIOGRAPHY CHEST WITH CONTRAST TECHNIQUE:  Multidetector CT imaging of the chest was performed using the standard protocol during bolus administration of intravenous contrast. Multiplanar CT image reconstructions and MIPs were obtained to evaluate the vascular anatomy. CONTRAST:  58mL OMNIPAQUE IOHEXOL 350 MG/ML SOLN COMPARISON:  None. FINDINGS: Cardiovascular: Satisfactory opacification of the pulmonary arteries to the segmental level. No evidence of pulmonary embolism. Normal heart size. No pericardial effusion. Mediastinum/Nodes: No enlarged mediastinal, hilar, or axillary lymph nodes. Thyroid gland, trachea, and esophagus demonstrate no significant findings. Lungs/Pleura: 2 mm and 3 mm noncalcified lung nodules are seen within the anterolateral aspect of the right upper lobe (axial CT images 40 and 58, CT series number 7). There is no evidence of acute infiltrate, pleural effusion or pneumothorax. Upper Abdomen: There is a small hiatal hernia. Musculoskeletal: No chest wall abnormality. No acute or significant osseous findings. Review of the MIP images confirms the above findings. IMPRESSION: 1. No evidence of pulmonary embolism or other acute intrathoracic  process. 2. 2 mm and 3 mm noncalcified right upper lobe lung nodules. In a low risk patient, no routine follow-up is warranted. In a high risk patient, 12 month follow-up chest CT is recommended. 3. Small hiatal hernia. Electronically Signed   By: Aram Candela M.D.   On: 01/09/2020 23:51    Procedures Procedures (including critical care time)  Medications Ordered in ED Medications  sodium chloride 0.9 % bolus 1,000 mL (0 mLs Intravenous Stopped 01/09/20 2234)  metoCLOPramide (REGLAN) injection 10 mg (10 mg Intravenous Given 01/09/20 2143)  iohexol (OMNIPAQUE) 350 MG/ML injection 75 mL (75 mLs Intravenous Contrast Given 01/09/20 2334)    ED Course  I have reviewed the triage vital signs and the nursing notes.  Pertinent labs & imaging results that were available during my care of  the patient were reviewed by me and considered in my medical decision making (see chart for details).    MDM Rules/Calculators/A&P                         patient here for evaluation of chest pain and headache. He was found to be hypertensive on EMS arrival. Blood pressure improved by the time he arrived to the emergency department. He is mildly tachycardic but in no acute distress. Troponin's are negative times two. CT head is negative for acute abnormality. Presentation is not consistent with cerebral aneurysm, dissection, ACS. On repeat assessment he is feeling improved. Discussed with patient importance of PCP follow-up and return precautions.  Final Clinical Impression(s) / ED Diagnoses Final diagnoses:  Precordial pain  Bad headache    Rx / DC Orders ED Discharge Orders    None       Tilden Fossa, MD 01/10/20 0021

## 2020-01-10 LAB — RESPIRATORY PANEL BY RT PCR (FLU A&B, COVID)
Influenza A by PCR: NEGATIVE
Influenza B by PCR: NEGATIVE
SARS Coronavirus 2 by RT PCR: NEGATIVE

## 2020-01-10 NOTE — ED Notes (Signed)
Patient verbalizes understanding of discharge instructions. Opportunity for questioning and answers were provided. Armband removed by staff, pt discharged from ED ambulatory to home.  

## 2021-05-30 IMAGING — CT CT ANGIO CHEST
2 of 6 series · 18 of 36 positions shown · IV contrast (omnipaque)
Comparison: None.

CLINICAL DATA: Left-sided chest pain.

EXAM:
CT ANGIOGRAPHY CHEST WITH CONTRAST
TECHNIQUE: Multidetector CT imaging of the chest was performed using the
standard protocol during bolus administration of intravenous
contrast. Multiplanar CT image reconstructions and MIPs were
obtained to evaluate the vascular anatomy.
CONTRAST:  75mL OMNIPAQUE IOHEXOL 350 MG/ML SOLN

[Series 8: pe thins · axial · 0.85mm/px · z∈[+411,+605]mm · 17 of 310 slices shown]
[im 16/310  lung]
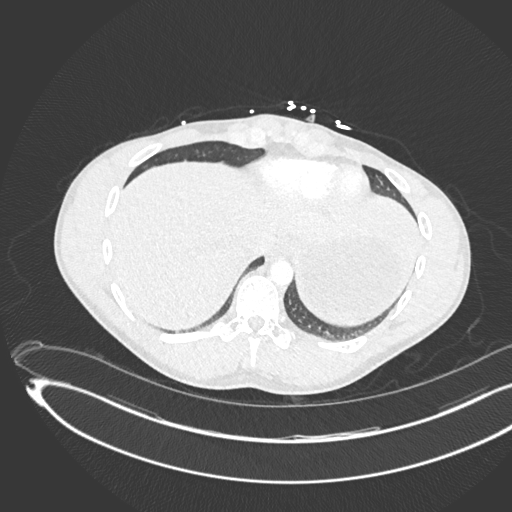
[im 31/310  mediastinal]
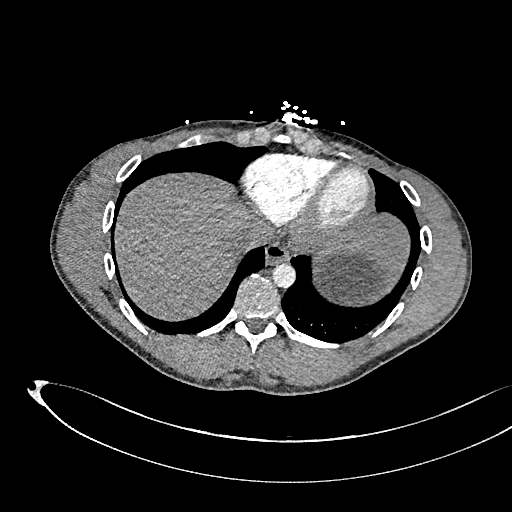
[im 47/310  lung]
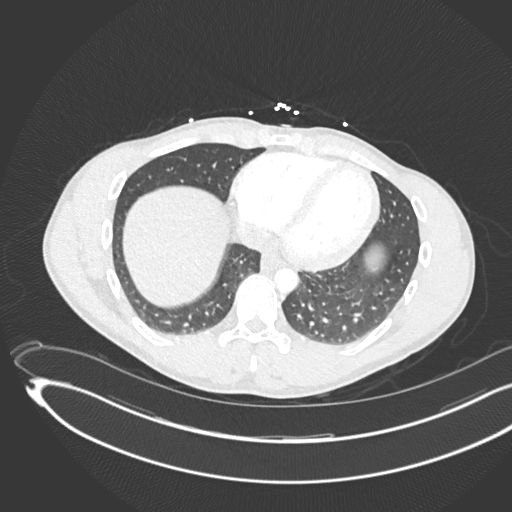
[im 62/310  mediastinal]
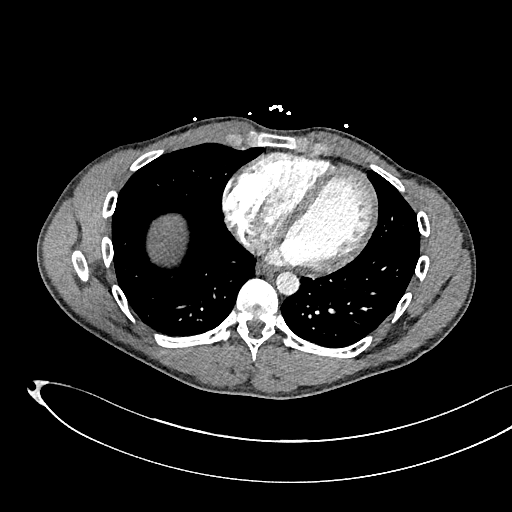
[im 93/310  lung]
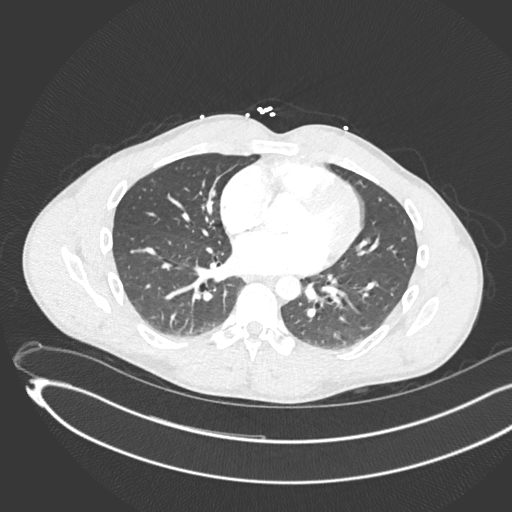
[im 109/310  mediastinal]
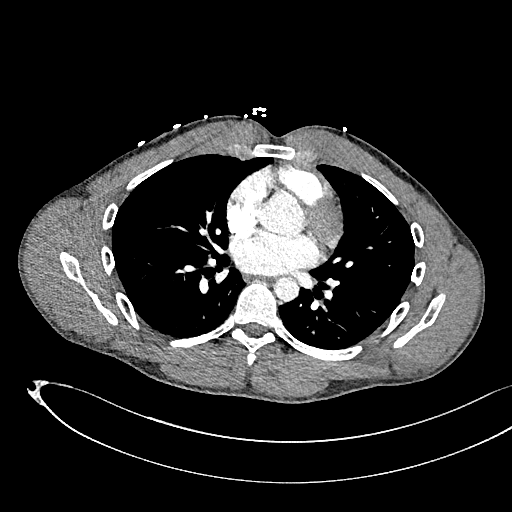
[im 124/310  lung]
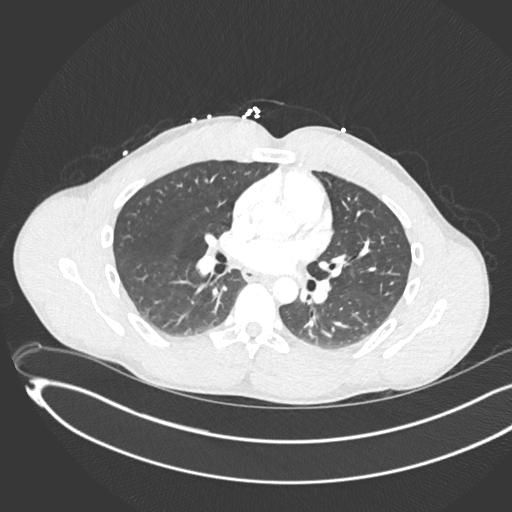
[im 140/310  mediastinal]
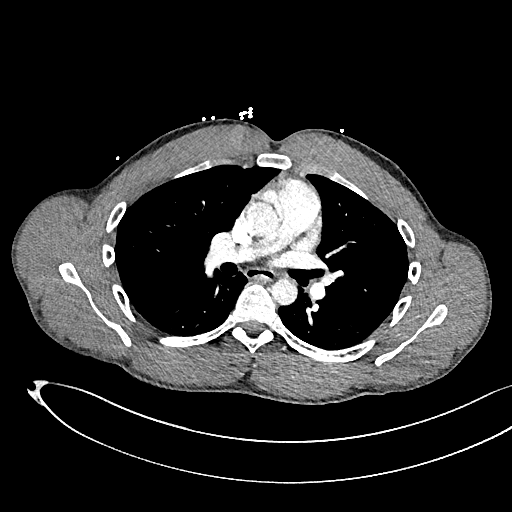
[im 155/310  lung]
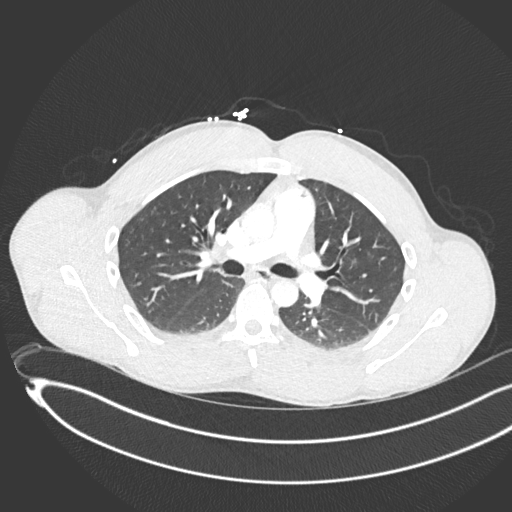
[im 170/310  mediastinal]
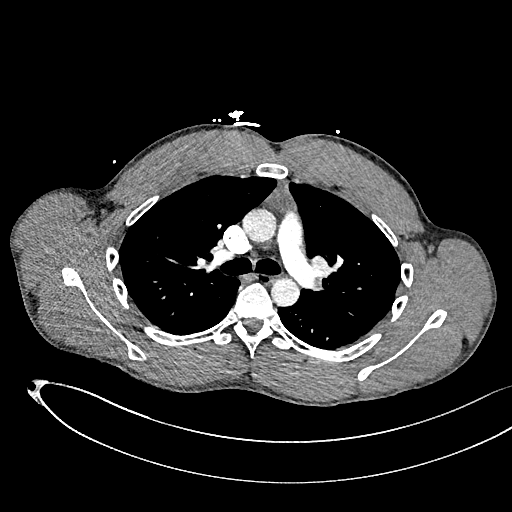
[im 186/310  lung]
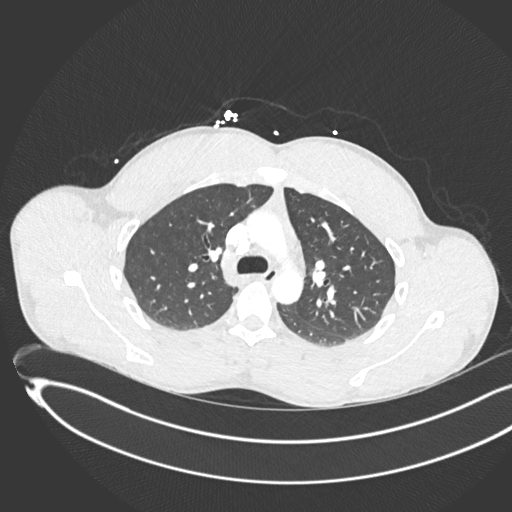
[im 201/310  mediastinal]
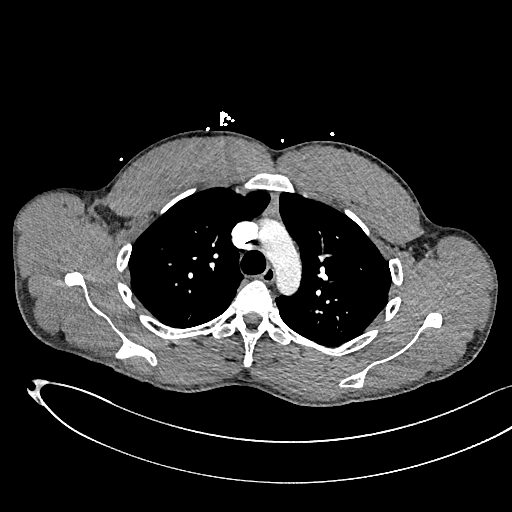
[im 217/310  lung]
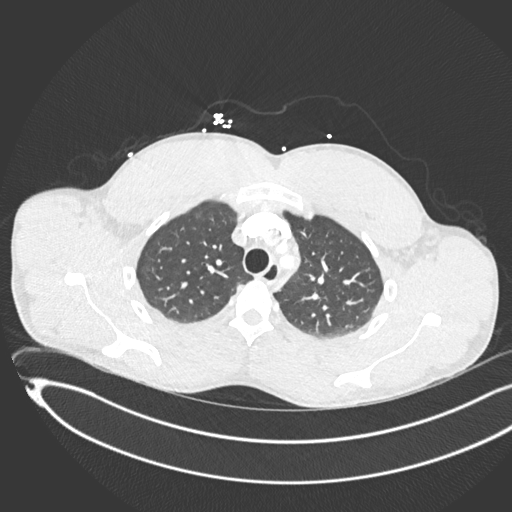
[im 248/310  mediastinal]
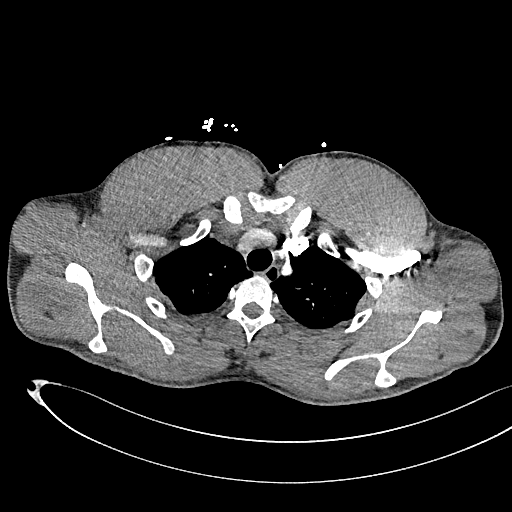
[im 263/310  lung]
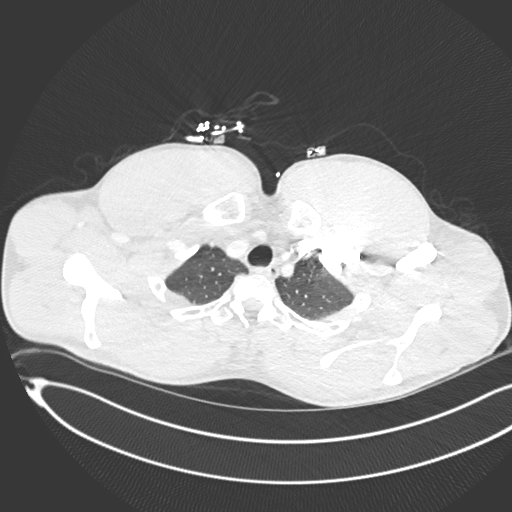
[im 279/310  mediastinal]
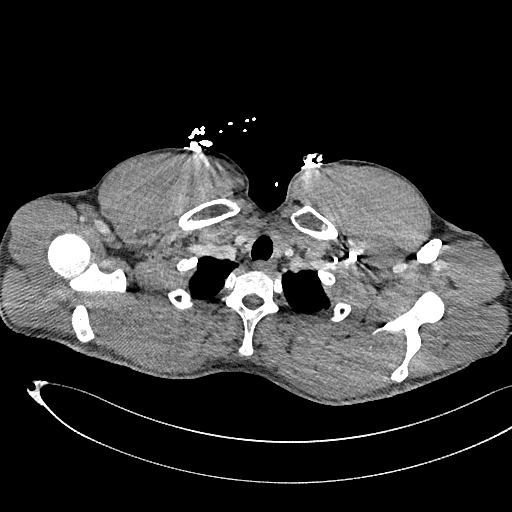
[im 294/310  lung]
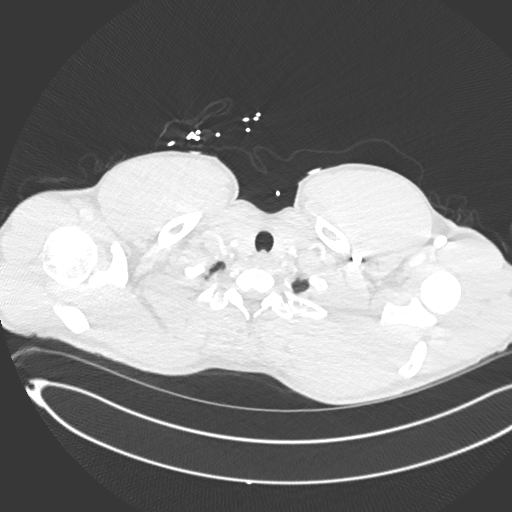

[Series 9: pe 2mm cor · coronal · 0.43mm/px · 1 of 116 slices shown]
[im 58/116  mediastinal]
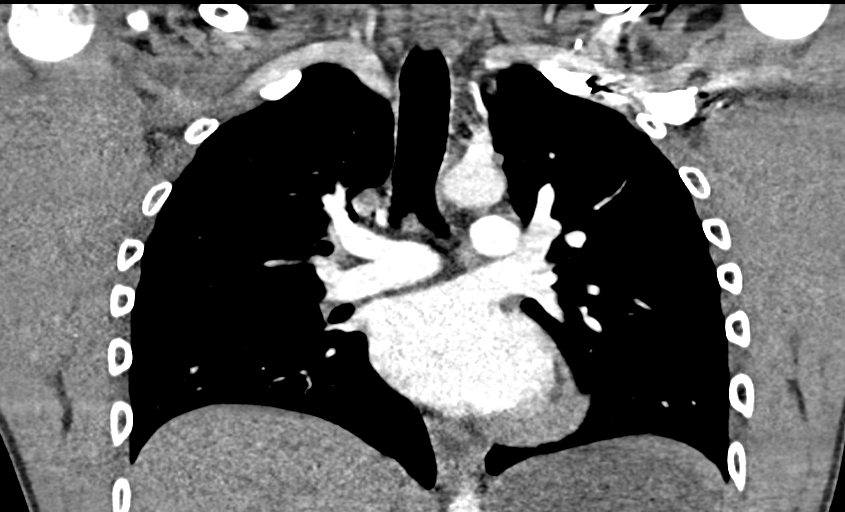

[18 of 36 positions shown; findings below may reference images not displayed]

FINDINGS: Cardiovascular: Satisfactory opacification of the pulmonary arteries
to the segmental level. No evidence of pulmonary embolism. Normal
heart size. No pericardial effusion.

Mediastinum/Nodes: No enlarged mediastinal, hilar, or axillary lymph
nodes. Thyroid gland, trachea, and esophagus demonstrate no
significant findings.

Lungs/Pleura: 2 mm and 3 mm noncalcified lung nodules are seen
within the anterolateral aspect of the right upper lobe (axial CT
images 40 and 58, CT series number 7).

There is no evidence of acute infiltrate, pleural effusion or
pneumothorax.

Upper Abdomen: There is a small hiatal hernia.

Musculoskeletal: No chest wall abnormality. No acute or significant
osseous findings.

Review of the MIP images confirms the above findings.
IMPRESSION: 1. No evidence of pulmonary embolism or other acute intrathoracic
process.
2. 2 mm and 3 mm noncalcified right upper lobe lung nodules. In a
low risk patient, no routine follow-up is warranted. In a high risk
patient, 12 month follow-up chest CT is recommended.
3. Small hiatal hernia.

## 2021-07-02 ENCOUNTER — Ambulatory Visit: Payer: BC Managed Care – PPO | Admitting: Family Medicine
